# Patient Record
Sex: Female | Born: 1955 | Race: White | Hispanic: No | Marital: Single | Smoking: Former smoker
Health system: Southern US, Community
[De-identification: ages and names within clinical notes are randomized; demographics above are authoritative.]

---

## 2008-01-16 ENCOUNTER — Ambulatory Visit: Payer: Self-pay | Admitting: Family Medicine

## 2009-11-18 ENCOUNTER — Ambulatory Visit: Payer: Self-pay | Admitting: Family Medicine

## 2015-05-05 ENCOUNTER — Ambulatory Visit: Payer: Self-pay | Attending: Oncology

## 2015-05-05 ENCOUNTER — Ambulatory Visit
Admission: RE | Admit: 2015-05-05 | Discharge: 2015-05-05 | Disposition: A | Discharge status: Home / Self Care | Payer: Self-pay | Source: Ambulatory Visit | Attending: Oncology | Admitting: Oncology

## 2015-05-05 VITALS — BP 128/80 | HR 90 | Temp 98.4°F | Resp 18 | Ht 64.57 in | Wt 168.9 lb

## 2015-05-05 DIAGNOSIS — Z0000 Encounter for general adult medical examination without abnormal findings: Secondary | ICD-10-CM

## 2015-05-05 NOTE — Progress Notes (Signed)
Subjective:     Patient ID: Andrea Mercado, female   DOB: [birth year], 59 y.o.   MRN: [id number]  HPI   Review of Systems     Objective:   Physical Exam  Pulmonary/Chest: Right breast exhibits no inverted nipple, no mass, no nipple discharge, no skin change and no tenderness. Left breast exhibits no inverted nipple, no mass, no nipple discharge, no skin change and no tenderness. Breasts are symmetrical.       Assessment:     59 year old patient presents for [facility] clinic visitPatient screened, and meets BCCCP eligibility.  Patient does not have insurance, Medicare or Medicaid.  Handout given on Affordable Care Act.  Instructed patient on breast self-exam using teach back method. CBE unremarkable.    Plan:     Sent for bilateral screening mammogram.

## 2015-06-01 NOTE — Progress Notes (Signed)
Letter mailed from [facility] to notify of normal mammogram results.  Patient to return in one year for annual screening.  Copy to [facility]. 

## 2016-09-12 ENCOUNTER — Telehealth: Payer: Self-pay

## 2016-09-12 NOTE — Telephone Encounter (Signed)
This patient walked in stating she was feeling dizzy/lightheaded and was trying to get back to [place]. Was wondering if we could check her BP. I consulted with Syliva Overman, RN and Dr Para March as to what I could do for her. We agreed to check her BP nad call her PCP. I checked her BP and it was 118/82. She informed me that she had been doing a 1000 calorie a day diet for the last 2 weeks. I called her PCP at [facility] [phone] and spoke to [place]. She made the pt an appointment at 1:10 today with Shane Crutch, PA. Pt was going to stop and get a drink in case it was a hypoglycemic episode.

## 2017-09-17 ENCOUNTER — Ambulatory Visit: Payer: Self-pay | Attending: Oncology

## 2018-04-10 ENCOUNTER — Encounter (INDEPENDENT_AMBULATORY_CARE_PROVIDER_SITE_OTHER): Payer: Self-pay

## 2018-04-10 ENCOUNTER — Ambulatory Visit
Admission: RE | Admit: 2018-04-10 | Discharge: 2018-04-10 | Disposition: A | Discharge status: Home / Self Care | Payer: Self-pay | Source: Ambulatory Visit | Attending: Oncology | Admitting: Oncology

## 2018-04-10 ENCOUNTER — Ambulatory Visit: Payer: Self-pay | Attending: Oncology

## 2018-04-10 VITALS — BP 148/89 | HR 80 | Temp 98.0°F | Ht 64.0 in | Wt 178.0 lb

## 2018-04-10 DIAGNOSIS — Z0000 Encounter for general adult medical examination without abnormal findings: Secondary | ICD-10-CM | POA: Insufficient documentation

## 2018-04-10 NOTE — Progress Notes (Signed)
  Subjective:     Patient ID: Mayer Masker, female   DOB: 19-Feb-1956, 62 y.o.   MRN: [id number]  HPI   Review of Systems     Objective:   Physical Exam  Pulmonary/Chest: Right breast exhibits no inverted nipple, no mass, no nipple discharge, no skin change and no tenderness. Left breast exhibits no inverted nipple, no mass, no nipple discharge, no skin change and no tenderness. Breasts are symmetrical.       Assessment:     62 year old patient returns for BCCCP screening.  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  Palliative Care is assisting.  Patient screened, and meets BCCCP eligibility.  Patient does not have insurance, Medicare or Medicaid.  Handout given on Affordable Care Act.  Instructed patient on breast self awareness using teach back method.  Clinical breast exam unremarkable.  No mass or lump palpated. Risk Assessment    Risk Scores      04/10/2018   Last edited by: Jim Like, RN   5-year risk: 2 %   Lifetime risk: 7.9 %            Plan:     Sent for bilateral screening mammogram.

## 2018-04-17 NOTE — Progress Notes (Signed)
Letter mailed from [facility] to notify of normal mammogram results.  Patient to return in one year for annual screening.  Copy to [facility]. 

## 2018-08-23 ENCOUNTER — Telehealth: Payer: Self-pay

## 2018-08-23 NOTE — Telephone Encounter (Signed)
Pt called c questions about the implant in arm.  [phone]  Pt's granddaughter has nexplanon and bleeds p IC since insertion.  Adv could not answer question as granddaughter nor her are our pt.  Adv to have granddaughter to call provider who inserted it.

## 2019-03-04 ENCOUNTER — Other Ambulatory Visit: Payer: Self-pay | Admitting: Family Medicine

## 2019-03-05 ENCOUNTER — Other Ambulatory Visit: Payer: Self-pay | Admitting: Family Medicine

## 2019-03-05 DIAGNOSIS — Z1231 Encounter for screening mammogram for malignant neoplasm of breast: Secondary | ICD-10-CM

## 2019-04-15 ENCOUNTER — Ambulatory Visit
Admission: RE | Admit: 2019-04-15 | Discharge: 2019-04-15 | Disposition: A | Discharge status: Home / Self Care | Payer: Medicare PPO | Source: Ambulatory Visit | Attending: Family Medicine | Admitting: Family Medicine

## 2019-04-15 DIAGNOSIS — Z1231 Encounter for screening mammogram for malignant neoplasm of breast: Secondary | ICD-10-CM | POA: Diagnosis not present

## 2019-04-29 ENCOUNTER — Other Ambulatory Visit: Payer: Self-pay | Admitting: Family Medicine

## 2019-04-30 ENCOUNTER — Other Ambulatory Visit: Payer: Self-pay | Admitting: Family Medicine

## 2019-04-30 ENCOUNTER — Other Ambulatory Visit: Payer: Self-pay

## 2019-04-30 ENCOUNTER — Ambulatory Visit
Admission: RE | Admit: 2019-04-30 | Discharge: 2019-04-30 | Disposition: A | Discharge status: Home / Self Care | Payer: Medicare PPO | Source: Ambulatory Visit | Attending: Family Medicine | Admitting: Family Medicine

## 2019-04-30 DIAGNOSIS — R1032 Left lower quadrant pain: Secondary | ICD-10-CM

## 2020-03-08 ENCOUNTER — Other Ambulatory Visit: Payer: Self-pay | Admitting: Family Medicine

## 2020-03-08 DIAGNOSIS — Z1231 Encounter for screening mammogram for malignant neoplasm of breast: Secondary | ICD-10-CM

## 2020-04-15 ENCOUNTER — Other Ambulatory Visit: Payer: Self-pay

## 2020-04-15 ENCOUNTER — Ambulatory Visit
Admission: RE | Admit: 2020-04-15 | Discharge: 2020-04-15 | Disposition: A | Discharge status: Home / Self Care | Payer: Medicare PPO | Source: Ambulatory Visit | Attending: Family Medicine | Admitting: Family Medicine

## 2020-04-15 DIAGNOSIS — Z1231 Encounter for screening mammogram for malignant neoplasm of breast: Secondary | ICD-10-CM | POA: Diagnosis present

## 2021-03-10 ENCOUNTER — Other Ambulatory Visit: Payer: Self-pay | Admitting: Family Medicine

## 2021-03-10 DIAGNOSIS — Z1231 Encounter for screening mammogram for malignant neoplasm of breast: Secondary | ICD-10-CM

## 2021-04-18 ENCOUNTER — Other Ambulatory Visit: Payer: Self-pay

## 2021-04-18 ENCOUNTER — Ambulatory Visit
Admission: RE | Admit: 2021-04-18 | Discharge: 2021-04-18 | Disposition: A | Discharge status: Home / Self Care | Payer: Medicare PPO | Source: Ambulatory Visit | Attending: Family Medicine | Admitting: Family Medicine

## 2021-04-18 DIAGNOSIS — Z1231 Encounter for screening mammogram for malignant neoplasm of breast: Secondary | ICD-10-CM | POA: Diagnosis not present

## 2022-01-31 ENCOUNTER — Other Ambulatory Visit (HOSPITAL_COMMUNITY): Payer: Self-pay | Admitting: Family Medicine

## 2022-01-31 ENCOUNTER — Other Ambulatory Visit: Payer: Self-pay | Admitting: Family Medicine

## 2022-01-31 DIAGNOSIS — I10 Essential (primary) hypertension: Secondary | ICD-10-CM

## 2022-01-31 DIAGNOSIS — E785 Hyperlipidemia, unspecified: Secondary | ICD-10-CM

## 2022-01-31 DIAGNOSIS — Z720 Tobacco use: Secondary | ICD-10-CM

## 2022-01-31 DIAGNOSIS — R42 Dizziness and giddiness: Secondary | ICD-10-CM

## 2022-02-09 ENCOUNTER — Ambulatory Visit: Payer: Medicare PPO

## 2022-03-13 ENCOUNTER — Other Ambulatory Visit: Payer: Self-pay | Admitting: Family Medicine

## 2022-03-13 DIAGNOSIS — Z1231 Encounter for screening mammogram for malignant neoplasm of breast: Secondary | ICD-10-CM

## 2022-04-20 ENCOUNTER — Ambulatory Visit
Admission: RE | Admit: 2022-04-20 | Discharge: 2022-04-20 | Disposition: A | Discharge status: Home / Self Care | Payer: Medicare PPO | Source: Ambulatory Visit | Attending: Family Medicine | Admitting: Family Medicine

## 2022-04-20 DIAGNOSIS — Z1231 Encounter for screening mammogram for malignant neoplasm of breast: Secondary | ICD-10-CM | POA: Insufficient documentation

## 2022-12-04 IMAGING — MG MM DIGITAL SCREENING BILAT W/ TOMO AND CAD
8 series · 8 of 24 positions shown · non-contrast
Comparison: Previous exam(s).

CLINICAL DATA: Screening.

EXAM:
DIGITAL SCREENING BILATERAL MAMMOGRAM WITH TOMOSYNTHESIS AND CAD
TECHNIQUE: Bilateral screening digital craniocaudal and mediolateral oblique
mammograms were obtained. Bilateral screening digital breast
tomosynthesis was performed. The images were evaluated with
computer-aided detection.

[R CC synth-2D]
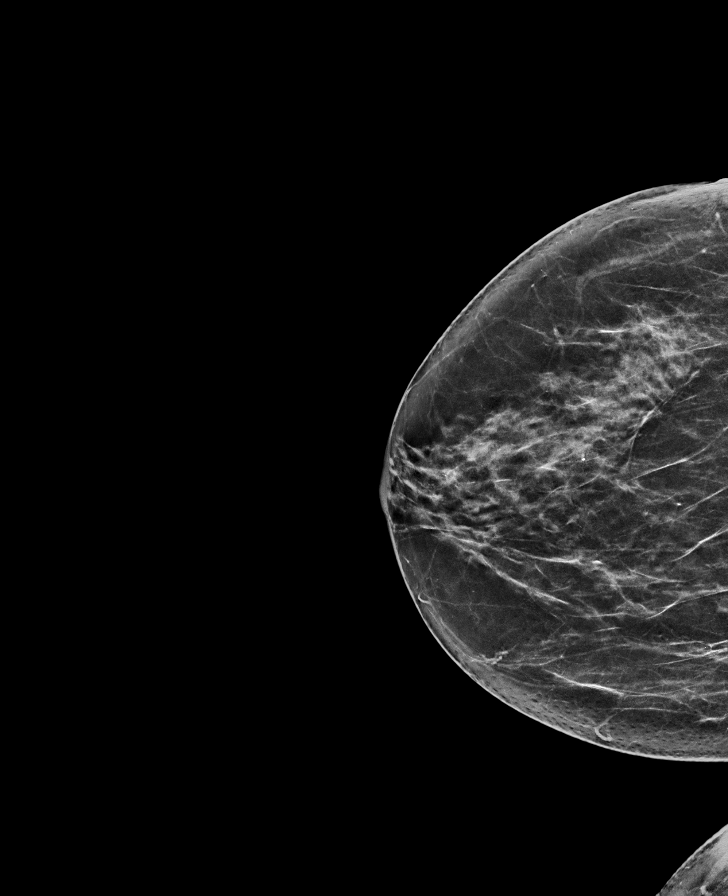

[R MLO synth-2D]
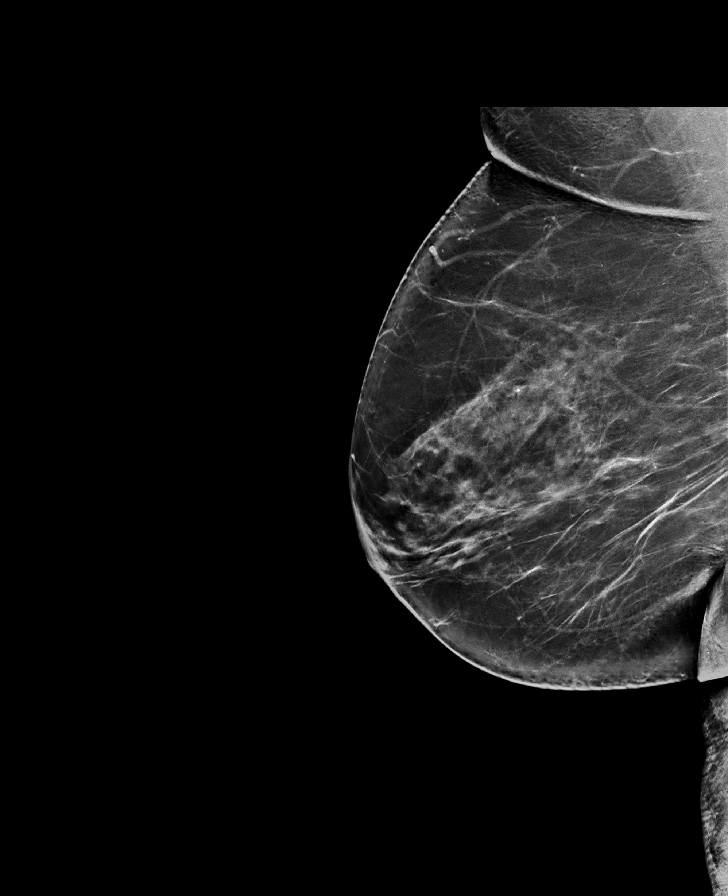

[L CC synth-2D]
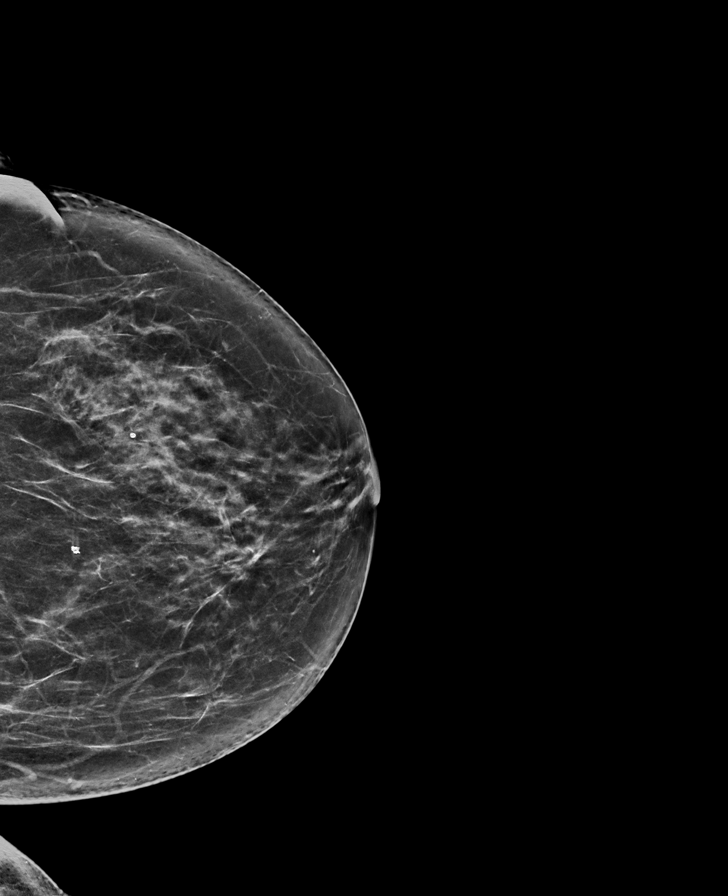

[L MLO synth-2D]
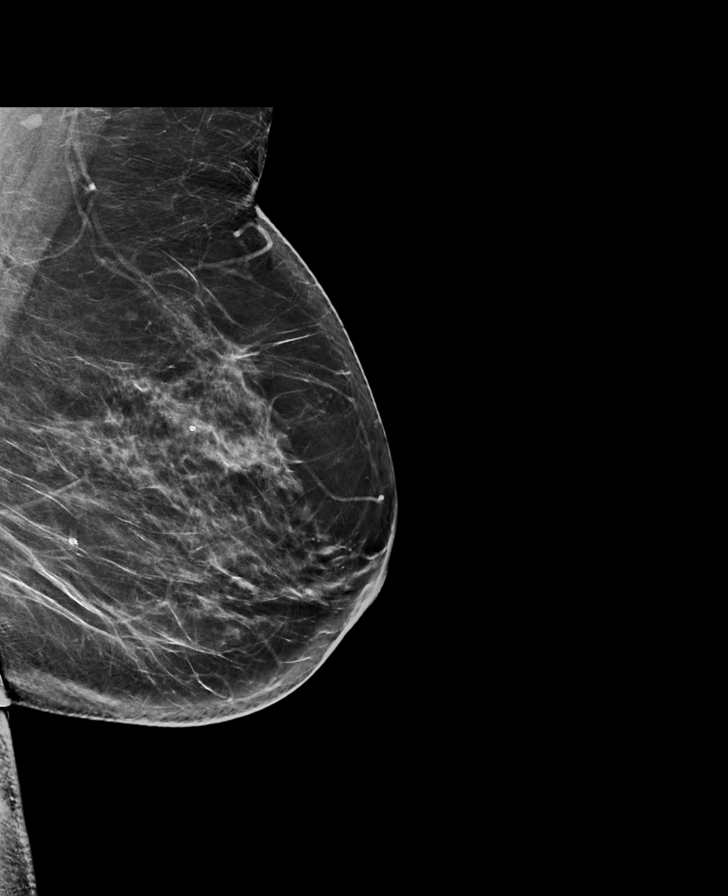

[L MLO tomo · tomo slice 40/79.0]
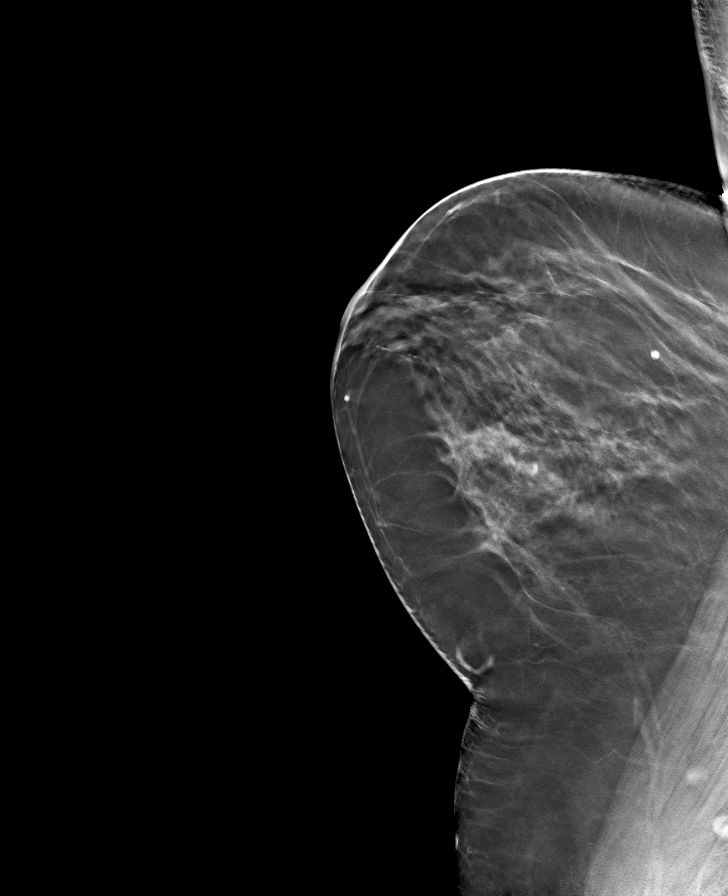

[L CC tomo · tomo slice 35/69.0]
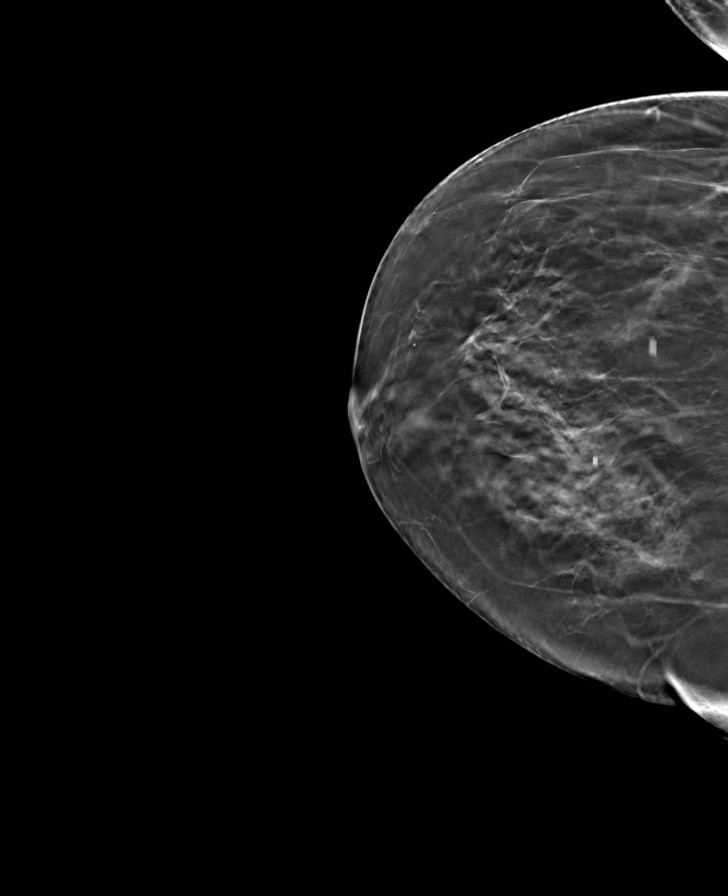

[R CC tomo · tomo slice 34/67.0]
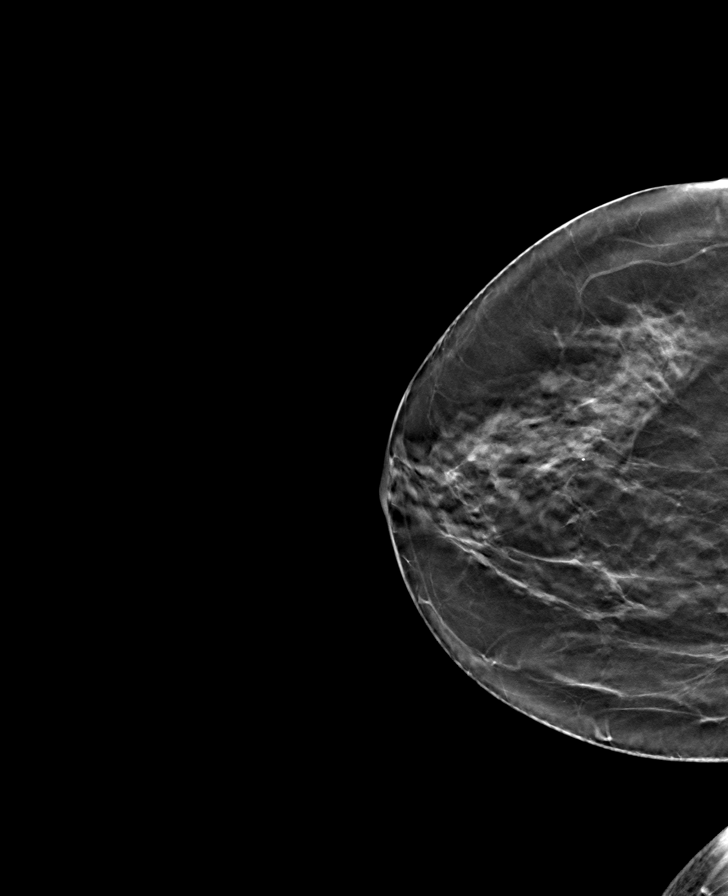

[R MLO tomo · tomo slice 41/81.0]
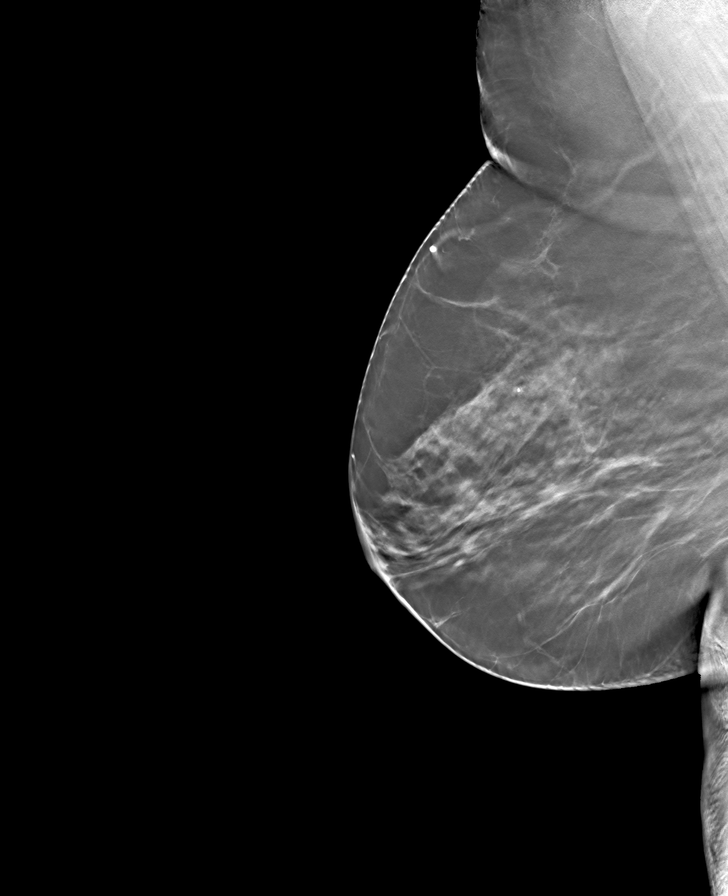

[8 of 24 positions shown; findings below may reference images not displayed]

ACR Breast Density Category c: The breast tissue is heterogeneously
dense, which may obscure small masses.
FINDINGS: There are no findings suspicious for malignancy.
IMPRESSION: No mammographic evidence of malignancy. A result letter of this
screening mammogram will be mailed directly to the patient.

RECOMMENDATION:
Screening mammogram in one year. (Code:Q3-W-BC3)

BI-RADS CATEGORY  1: Negative.

## 2022-12-11 LAB — EXTERNAL GENERIC LAB PROCEDURE: COLOGUARD: NEGATIVE

## 2022-12-11 LAB — COLOGUARD: COLOGUARD: NEGATIVE

## 2023-03-13 ENCOUNTER — Other Ambulatory Visit: Payer: Self-pay | Admitting: Family Medicine

## 2023-03-13 DIAGNOSIS — Z1231 Encounter for screening mammogram for malignant neoplasm of breast: Secondary | ICD-10-CM

## 2023-04-24 ENCOUNTER — Ambulatory Visit
Admission: RE | Admit: 2023-04-24 | Discharge: 2023-04-24 | Disposition: A | Discharge status: Home / Self Care | Payer: Medicare PPO | Source: Ambulatory Visit | Attending: Family Medicine | Admitting: Family Medicine

## 2023-04-24 DIAGNOSIS — Z1231 Encounter for screening mammogram for malignant neoplasm of breast: Secondary | ICD-10-CM | POA: Diagnosis present

## 2023-10-08 ENCOUNTER — Other Ambulatory Visit: Payer: Self-pay | Admitting: Family Medicine

## 2023-10-08 DIAGNOSIS — Z780 Asymptomatic menopausal state: Secondary | ICD-10-CM

## 2024-03-19 ENCOUNTER — Other Ambulatory Visit: Payer: Self-pay | Admitting: Family Medicine

## 2024-03-19 DIAGNOSIS — Z1231 Encounter for screening mammogram for malignant neoplasm of breast: Secondary | ICD-10-CM

## 2024-04-24 ENCOUNTER — Encounter
